# Patient Record
Sex: Female | Born: 1995 | Race: White | Hispanic: No | Marital: Single | Smoking: Never smoker
Health system: Southern US, Community
[De-identification: ages and names within clinical notes are randomized; demographics above are authoritative.]

## PROBLEM LIST (undated history)

## (undated) DIAGNOSIS — F329 Major depressive disorder, single episode, unspecified: Secondary | ICD-10-CM

## (undated) DIAGNOSIS — F419 Anxiety disorder, unspecified: Secondary | ICD-10-CM

## (undated) DIAGNOSIS — R569 Unspecified convulsions: Secondary | ICD-10-CM

## (undated) DIAGNOSIS — F319 Bipolar disorder, unspecified: Secondary | ICD-10-CM

## (undated) DIAGNOSIS — F32A Depression, unspecified: Secondary | ICD-10-CM

## (undated) DIAGNOSIS — D496 Neoplasm of unspecified behavior of brain: Secondary | ICD-10-CM

## (undated) HISTORY — DX: Anxiety disorder, unspecified: F41.9

## (undated) HISTORY — DX: Depression, unspecified: F32.A

## (undated) HISTORY — DX: Bipolar disorder, unspecified: F31.9

## (undated) HISTORY — DX: Major depressive disorder, single episode, unspecified: F32.9

---
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
## 2014-05-17 ENCOUNTER — Ambulatory Visit (INDEPENDENT_AMBULATORY_CARE_PROVIDER_SITE_OTHER): Payer: 59 | Admitting: Psychiatry

## 2014-05-17 ENCOUNTER — Encounter (INDEPENDENT_AMBULATORY_CARE_PROVIDER_SITE_OTHER): Payer: Self-pay

## 2014-05-17 ENCOUNTER — Encounter (HOSPITAL_COMMUNITY): Payer: Self-pay | Admitting: Psychiatry

## 2014-05-17 VITALS — BP 120/80 | HR 82 | Ht 59.0 in | Wt 110.0 lb

## 2014-05-17 DIAGNOSIS — F3130 Bipolar disorder, current episode depressed, mild or moderate severity, unspecified: Secondary | ICD-10-CM

## 2014-05-17 MED ORDER — LITHIUM CARBONATE 300 MG PO TABS: 300.0000 mg | ORAL_TABLET | Freq: Two times a day (BID) | ORAL | Status: DC

## 2014-05-17 MED ORDER — CITALOPRAM HYDROBROMIDE 10 MG PO TABS: 5.0000 mg | ORAL_TABLET | Freq: Every day | ORAL | Status: DC
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
## 2014-06-15 ENCOUNTER — Ambulatory Visit (HOSPITAL_COMMUNITY): Payer: Self-pay | Admitting: Psychiatry

## 2014-06-18 ENCOUNTER — Other Ambulatory Visit (HOSPITAL_COMMUNITY): Payer: Self-pay | Admitting: Psychiatry

## 2014-06-21 ENCOUNTER — Ambulatory Visit (HOSPITAL_COMMUNITY): Payer: Self-pay | Admitting: Psychiatry

## 2014-06-25 NOTE — Telephone Encounter (Signed)
celexa 5mg  refill sent to pharmacy

## 2014-07-02 ENCOUNTER — Ambulatory Visit (INDEPENDENT_AMBULATORY_CARE_PROVIDER_SITE_OTHER): Payer: 59 | Admitting: Psychiatry

## 2014-07-02 VITALS — BP 121/90 | HR 85 | Wt 110.0 lb

## 2014-07-02 DIAGNOSIS — F3130 Bipolar disorder, current episode depressed, mild or moderate severity, unspecified: Secondary | ICD-10-CM

## 2014-07-02 DIAGNOSIS — F411 Generalized anxiety disorder: Secondary | ICD-10-CM

## 2014-07-02 MED ORDER — LITHIUM CARBONATE 300 MG PO TABS: 300.0000 mg | ORAL_TABLET | Freq: Two times a day (BID) | ORAL | Status: DC

## 2014-07-02 MED ORDER — CITALOPRAM HYDROBROMIDE 10 MG PO TABS: 5.0000 mg | ORAL_TABLET | Freq: Every day | ORAL | Status: DC

## 2014-07-02 NOTE — Progress Notes (Signed)
Patient ID: Tonya Mullins, female   DOB: 1995/11/29, 18 y.o.   MRN: [id number] [place] Follow-up Outpatient Visit  Itzayana Pardy [id number] 18 y.o.  07/02/2014  Chief Complaint: depression and follow up.     History of Present Illness:   Patient returns for Medication Follow up and is diagnosed with Bipolar disorder, depressed. Initially presented with depression and needed to continue with lithium. Regarding to affective symptoms when she is in the downside it would last for weeks disturbed sleep disturbed energy and appetite feeling focus and concentrate crying spells. She has been diagnosed with Maj. depressive disorder around age 8. There is no associated psychotic symptoms.  In regarding to manic symptoms when she was on SSRI it made her to become hyper her mind starts racing she spent a lot of money she was trying to finish projects she was feeling on the goal and her mood was elevated with some irritability. Since she has been on lithium her mood symptoms are doing better and reasonable.  Duration of symptoms since age 59.  We continued lithium and added small dose of Celexa. Celexa did help her anxiety. In the past she was using Xanax for anxiety. She ran low on  prescription and is having some symptoms of depression but not hopelessness or significant panic attacks Modifying factors are current job mom and financial support. Aggravating factors include breakup in relationship poor sleep and depending upon the stress level.  Severity of depression or mood symptoms as of now is 9/10. 10 being very happy  Is also notes he has been diagnosed with brain tumor does follow up with Dr. Sherley Bounds and also has a Publishing rights manager. Last MRI according to her report did not show any further growth she has had one time seizure when she was age 44  She also worries about being around people uncomfortable in social situations and anxiety related to be in social gatherings.   Celexa does help those symptoms. Does not report having side effects No psychotic symptoms no psychosis or paranoia  She understands she needs to not restart back on marijuana alcohol. He may have been contributing somewhat to her mood symptoms. In addition she does have a brain tumor which may be contributing to her mood symptoms. There's no reported side effects lithium lithium levels checked on record has been 0.1 that is below normal. TSH was also normal according to [facility] lab summary.   Past Medical History  Diagnosis Date  . Seizures   . Brain tumor   . Anxiety   . Bipolar disorder   . Depression    Family History  Problem Relation Age of Onset  . Anxiety disorder Mother   . Bipolar disorder Father     Outpatient Encounter Prescriptions as of 07/02/2014  Medication Sig  . citalopram (CELEXA) 10 MG tablet Take 0.5 tablets (5 mg total) by mouth daily.  Marland Kitchen levETIRAcetam (KEPPRA) 750 MG tablet Take 750 mg by mouth 3 (three) times daily.  Marland Kitchen lithium 300 MG tablet Take 1 tablet (300 mg total) by mouth 2 (two) times daily.  . Norgestimate-Ethinyl Estradiol Triphasic (TRI-PREVIFEM) 0.18/0.215/0.25 MG-35 MCG tablet Take 1 tablet by mouth every evening.  . [DISCONTINUED] citalopram (CELEXA) 10 MG tablet take 1/2 tablet by mouth once daily  . [DISCONTINUED] lithium 300 MG tablet Take 1 tablet (300 mg total) by mouth 2 (two) times daily.    No results found for this or any previous visit (from the past 2160 hour(s)).  BP  121/90  Pulse 85  Wt 110 lb (49.896 kg)   Review of Systems  Neurological: Negative for dizziness, tremors and headaches.  Psychiatric/Behavioral: Negative for depression, suicidal ideas and substance abuse. The patient is nervous/anxious. The patient does not have insomnia.     Mental Status Examination  Appearance: casual Alert: Yes Attention: fair  Cooperative: Yes Eye Contact: Fair Speech: normal Psychomotor Activity: Normal Memory/Concentration:  adequate Oriented: person, place and time/date Mood: Euthymic Affect: Congruent Thought Processes and Associations: Coherent Fund of Knowledge: Fair Thought Content: Suicidal ideation and Homicidal ideation were denied. No hallucinations.  Insight: Fair Judgement: Fair  Diagnosis: Bipolar disorder, depressed. Rule out mood disorder suicide to general medical condition. Brain tumor history. Anxiety disorder NOS  Treatment Plan:   Continue lithium. Restart Celexa 5 mg for anxiety and depression. We will get blood work done as of now she's not reporting having any side effects related to medications.  Pertinent Labs and Relevant Prior Notes reviewed. Medication Side effects, benefits and risks reviewed/discussed with Patient. Time given for patient to respond and asks questions regarding the Diagnosis and Medications. Safety concerns and to report to ER if suicidal or call 911. Relevant Medications refilled or called in to pharmacy. Discussed weight maintenance and Sleep Hygiene. Follow up with Primary care provider in regards to Medical conditions. Recommend compliance with medications and follow up office appointments. Discussed to avail opportunity to consider or/and continue Individual therapy with Counselor. Greater than 50% of time was spend in counseling and coordination of care with the patient.  Schedule for Follow up visit in 6 weeks or call in earlier as necessary.  Merian Capron, MD 07/02/2014

## 2014-08-13 ENCOUNTER — Ambulatory Visit (HOSPITAL_COMMUNITY): Payer: Self-pay | Admitting: Psychiatry

## 2014-08-28 ENCOUNTER — Other Ambulatory Visit (HOSPITAL_COMMUNITY): Payer: Self-pay | Admitting: Psychiatry

## 2014-09-17 ENCOUNTER — Encounter (HOSPITAL_COMMUNITY): Payer: Self-pay | Admitting: Psychiatry

## 2014-09-17 ENCOUNTER — Ambulatory Visit (INDEPENDENT_AMBULATORY_CARE_PROVIDER_SITE_OTHER): Payer: 59 | Admitting: Psychiatry

## 2014-09-17 VITALS — BP 134/83 | HR 83 | Ht 59.0 in | Wt 133.0 lb

## 2014-09-17 DIAGNOSIS — F419 Anxiety disorder, unspecified: Secondary | ICD-10-CM

## 2014-09-17 DIAGNOSIS — F3130 Bipolar disorder, current episode depressed, mild or moderate severity, unspecified: Secondary | ICD-10-CM

## 2014-09-17 MED ORDER — CITALOPRAM HYDROBROMIDE 10 MG PO TABS: 5.0000 mg | ORAL_TABLET | Freq: Every day | ORAL | Status: DC

## 2014-09-17 MED ORDER — LITHIUM CARBONATE 300 MG PO TABS: 300.0000 mg | ORAL_TABLET | Freq: Two times a day (BID) | ORAL | Status: DC

## 2014-09-17 NOTE — Progress Notes (Signed)
Patient ID: Tonya Mullins, female   DOB: 10-06-1996, 18 y.o.   MRN: [id number] [place] Follow-up Outpatient Visit  Cydnie Deason [id number] 18 y.o.  09/17/2014  Chief Complaint: depression and follow up.     History of Present Illness:   Patient returns for Medication Follow up and is diagnosed with Bipolar disorder, depressed. Initially presented with depression and needed to continue with lithium.  Regarding to affective symptoms when she is in the downside it would last for weeks disturbed sleep disturbed energy and appetite feeling focus and concentrate crying spells. She has been diagnosed with Maj. depressive disorder around age 74. There is no associated psychotic symptoms.   In regarding to manic symptoms when she was on SSRI it made her to become hyper her mind starts racing she spent a lot of money she was trying to finish projects she was feeling on the goal and her mood was elevated with some irritability. Since she has been on lithium her mood symptoms are doing better and reasonable.   Duration of symptoms since age 83.  She did refill the Celexa and she has been doing reasonable there is no hopelessness or depression. She feels comfortable just taking the lithium lab work has not been done. She is working full-time and also doing courses.  Modifying factors are current job mom and financial support. Aggravating factors include breakup in relationship poor sleep and depending upon the stress level.   Severity of depression or mood symptoms as of now is 9/10. 10 being very happy  Is also notes he has been diagnosed with brain tumor does follow up with Dr. Sherley Bounds and also has a Publishing rights manager. Last MRI according to her report did not show any further growth she has had one time seizure when she was age 62     Past Medical History  Diagnosis Date  . Seizures   . Brain tumor   . Anxiety   . Bipolar disorder   . Depression    Family  History  Problem Relation Age of Onset  . Anxiety disorder Mother   . Bipolar disorder Father     Outpatient Encounter Prescriptions as of 09/17/2014  Medication Sig  . levETIRAcetam (KEPPRA) 750 MG tablet Take 750 mg by mouth 3 (three) times daily.  Marland Kitchen lithium 300 MG tablet Take 1 tablet (300 mg total) by mouth 2 (two) times daily.  . Norgestimate-Ethinyl Estradiol Triphasic (TRI-PREVIFEM) 0.18/0.215/0.25 MG-35 MCG tablet Take 1 tablet by mouth every evening.  . [DISCONTINUED] citalopram (CELEXA) 10 MG tablet Take 0.5 tablets (5 mg total) by mouth daily.  . [DISCONTINUED] citalopram (CELEXA) 10 MG tablet Take 0.5 tablets (5 mg total) by mouth daily.  . [DISCONTINUED] lithium 300 MG tablet Take 1 tablet (300 mg total) by mouth 2 (two) times daily.    No results found for this or any previous visit (from the past 2160 hour(s)).  BP 134/83 mmHg  Pulse 83  Ht 4\' 11"  (1.499 m)  Wt 133 lb (60.328 kg)  BMI 26.85 kg/m2   Review of Systems  Cardiovascular: Negative for chest pain.  Skin: Negative for rash.  Neurological: Negative for tremors and headaches.  Psychiatric/Behavioral: Negative for depression, suicidal ideas and substance abuse. The patient is nervous/anxious. The patient does not have insomnia.     Mental Status Examination  Appearance: casual Alert: Yes Attention: fair  Cooperative: Yes Eye Contact: Fair Speech: normal Psychomotor Activity: Normal Memory/Concentration: adequate Oriented: person, place and time/date Mood: Euthymic Affect:  Congruent Thought Processes and Associations: Coherent Fund of Knowledge: Fair Thought Content: Suicidal ideation and Homicidal ideation were denied. No hallucinations.  Insight: Fair Judgement: Fair  Diagnosis: Bipolar disorder, depressed. Rule out mood disorder suicide to general medical condition. Brain tumor history. Anxiety disorder NOS  Treatment Plan:   Continue lithium. Stop celexa not using it. Will write down for  labs including lithium. Abstain from [place]. Did not endorse using it today.  Pertinent Labs and Relevant Prior Notes reviewed. Medication Side effects, benefits and risks reviewed/discussed with Patient. Time given for patient to respond and asks questions regarding the Diagnosis and Medications. Safety concerns and to report to ER if suicidal or call 911. Relevant Medications refilled or called in to pharmacy. Discussed weight maintenance and Sleep Hygiene. Follow up with Primary care provider in regards to Medical conditions. Recommend compliance with medications and follow up office appointments. Discussed to avail opportunity to consider or/and continue Individual therapy with Counselor. Greater than 50% of time was spend in counseling and coordination of care with the patient.  Schedule for Follow up visit in 8 weeks or call in earlier as necessary.  Merian Capron, MD 09/17/2014

## 2014-09-18 LAB — LITHIUM LEVEL: LITHIUM LVL: 0.4 meq/L — AB (ref 0.80–1.40)

## 2014-09-18 LAB — TSH: TSH: 1.316 u[IU]/mL (ref 0.350–4.500)

## 2014-11-20 ENCOUNTER — Ambulatory Visit (HOSPITAL_COMMUNITY): Payer: Self-pay | Admitting: Psychiatry

## 2014-11-24 ENCOUNTER — Other Ambulatory Visit (HOSPITAL_COMMUNITY): Payer: Self-pay | Admitting: Psychiatry

## 2014-11-30 ENCOUNTER — Ambulatory Visit (INDEPENDENT_AMBULATORY_CARE_PROVIDER_SITE_OTHER): Payer: 59 | Admitting: Psychiatry

## 2014-11-30 ENCOUNTER — Encounter (HOSPITAL_COMMUNITY): Payer: Self-pay | Admitting: Psychiatry

## 2014-11-30 ENCOUNTER — Encounter (INDEPENDENT_AMBULATORY_CARE_PROVIDER_SITE_OTHER): Payer: Self-pay

## 2014-11-30 VITALS — BP 120/80 | HR 88 | Ht 60.0 in | Wt 120.0 lb

## 2014-11-30 DIAGNOSIS — F3130 Bipolar disorder, current episode depressed, mild or moderate severity, unspecified: Secondary | ICD-10-CM

## 2014-11-30 DIAGNOSIS — F419 Anxiety disorder, unspecified: Secondary | ICD-10-CM

## 2014-11-30 DIAGNOSIS — C719 Malignant neoplasm of brain, unspecified: Secondary | ICD-10-CM

## 2014-11-30 MED ORDER — LITHIUM CARBONATE 300 MG PO TABS: 300.0000 mg | ORAL_TABLET | Freq: Two times a day (BID) | ORAL | Status: DC

## 2014-11-30 NOTE — Progress Notes (Signed)
Patient ID: Tonya Mullins, female   DOB: [birth year], 19 y.o.   MRN: [id number] [facility] Follow-up Outpatient Visit  Amai Cappiello [id number] 18 y.o.  11/30/2014  Chief Complaint: depression and follow up.     History of Present Illness:   Patient returns for Medication Follow up and is diagnosed with Bipolar disorder, depressed. Initially presented with depression and needed to continue with lithium.  Regarding to affective symptoms when she is in the downside it would last for weeks disturbed sleep disturbed energy and appetite feeling focus and concentrate crying spells. She has been diagnosed with Maj. depressive disorder around age 62. There is no associated psychotic symptoms.  In regarding to manic symptoms when she was on SSRI it made her to become hyper her mind starts racing she spent a lot of money she was trying to finish projects she was feeling on the goal and her mood was elevated with some irritability. Since she has been on lithium her mood symptoms are doing better and reasonable.  Duration of symptoms since age 23.  . She is doing reasonable dose of 6 mg of lithium. Her blood work came back 0.4. She is not having any side effects. 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 She feels her mood is balanced.  Modifying factors are current job mom and financial support. Aggravating factors include breakup in relationship poor sleep and depending upon the stress level.   Severity of depression or mood symptoms as of now is 9/10. 10 being very happy  Is also notes he has been diagnosed with brain tumor does follow up with Dr. Sherley Bounds and also has a Publishing rights manager. Last MRI according to her report did not show any further growth she has had one time seizure when she was age 71     Past Medical History  Diagnosis Date  . Seizures   . Brain tumor   . Anxiety   . Bipolar disorder   . Depression    Family History  Problem Relation Age of Onset   . Anxiety disorder Mother   . Bipolar disorder Father     Outpatient Encounter Prescriptions as of 11/30/2014  Medication Sig  . levETIRAcetam (KEPPRA) 750 MG tablet Take 750 mg by mouth 3 (three) times daily.  Marland Kitchen lithium 300 MG tablet Take 1 tablet (300 mg total) by mouth 2 (two) times daily.  . Norgestimate-Ethinyl Estradiol Triphasic (TRI-PREVIFEM) 0.18/0.215/0.25 MG-35 MCG tablet Take 1 tablet by mouth every evening.  . [DISCONTINUED] lithium 300 MG tablet Take 1 tablet (300 mg total) by mouth 2 (two) times daily.    Recent Results (from the past 2160 hour(s))  Lithium level     Status: Abnormal   Collection Time: 09/17/14  2:29 PM  Result Value Ref Range   Lithium Lvl 0.40 (L) 0.80 - 1.40 mEq/L  TSH     Status: None   Collection Time: 09/17/14  2:29 PM  Result Value Ref Range   TSH 1.316 0.350 - 4.500 uIU/mL    BP 120/80 mmHg  Pulse 88  Ht 5' (1.524 m)  Wt 120 lb (54.432 kg)  BMI 23.44 kg/m2   Review of Systems  Gastrointestinal: Negative for nausea.  Neurological: Negative for tremors and headaches.  Psychiatric/Behavioral: Negative for suicidal ideas.    Mental Status Examination  Appearance: casual Alert: Yes Attention: fair  Cooperative: Yes Eye Contact: Fair Speech: normal Psychomotor Activity: Normal Memory/Concentration: adequate Oriented: person, place and time/date Mood: Euthymic Affect: Congruent  Thought Processes and Associations: Coherent Fund of Knowledge: Fair Thought Content: Suicidal ideation and Homicidal ideation were denied. No hallucinations.  Insight: Fair Judgement: Fair  Diagnosis: Bipolar disorder, depressed. Rule out mood disorder suicide to general medical condition. Brain tumor history. Anxiety disorder NOS  Treatment Plan:   Continue lithium at dose of 300mg  bid.  No tremors or side effects. Abstain from [place]. Did not endorse using it today.  Pertinent Labs and Relevant Prior Notes reviewed. Medication Side effects,  benefits and risks reviewed/discussed with Patient. Time given for patient to respond and asks questions regarding the Diagnosis and Medications. Safety concerns and to report to ER if suicidal or call 911. Relevant Medications refilled or called in to pharmacy. Discussed weight maintenance and Sleep Hygiene. Follow up with Primary care provider in regards to Medical conditions. Recommend compliance with medications and follow up office appointments. Discussed to avail opportunity to consider or/and continue Individual therapy with Counselor. Greater than 50% of time was spend in counseling and coordination of care with the patient.  Schedule for Follow up visit in 12 weeks or call in earlier as necessary.  Merian Capron, MD 11/30/2014

## 2015-03-01 ENCOUNTER — Ambulatory Visit (HOSPITAL_COMMUNITY): Payer: Self-pay | Admitting: Psychiatry

## 2015-03-08 ENCOUNTER — Other Ambulatory Visit (HOSPITAL_COMMUNITY): Payer: Self-pay | Admitting: Psychiatry

## 2015-03-11 NOTE — Telephone Encounter (Signed)
Received fax from [facility] for a refill for Lithium 300mg . Per Dr. De Nurse, pt is authorized for a refill for Lithium 300mg , qty 60. Prescription was sent to pharmacy. Pt has a f/u appt on 5/27. Called and informed pt of prescription status. Pt states and shows understanding.

## 2015-03-29 ENCOUNTER — Ambulatory Visit (HOSPITAL_COMMUNITY): Payer: 59 | Admitting: Psychiatry

## 2015-04-08 ENCOUNTER — Other Ambulatory Visit (HOSPITAL_COMMUNITY): Payer: Self-pay | Admitting: Psychiatry

## 2015-04-16 ENCOUNTER — Encounter (HOSPITAL_COMMUNITY): Payer: Self-pay | Admitting: Psychiatry

## 2015-04-16 NOTE — Telephone Encounter (Signed)
Received fax from [facility] for a refill for Lithium 300mg . Per Dr. De Nurse, pt is authorized for a refill for Lithium 300mg , Qty 60. Prescription was sent to pharmacy.
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
